# Patient Record
Sex: Female | Born: 1990 | Hispanic: Yes | Marital: Single | Smoking: Never smoker
Health system: Southern US, Community
[De-identification: ages and names within clinical notes are randomized; demographics above are authoritative.]

---

## 2008-11-14 ENCOUNTER — Ambulatory Visit (HOSPITAL_COMMUNITY): Admission: RE | Admit: 2008-11-14 | Discharge: 2008-11-14 | Payer: Self-pay | Admitting: Obstetrics and Gynecology

## 2008-12-28 ENCOUNTER — Ambulatory Visit (HOSPITAL_COMMUNITY): Admission: RE | Admit: 2008-12-28 | Discharge: 2008-12-28 | Payer: Self-pay | Admitting: Obstetrics and Gynecology

## 2009-11-30 IMAGING — US US OB FOLLOW-UP
1 series · 14 of 28 positions shown · non-contrast
Comparison: none

OBSTETRICAL ULTRASOUND:
 This ultrasound was performed in The [HOSPITAL], and the [facility] report will be stored to [REDACTED] PACS.

[Series 1: us ob follow-up · 14 of 58 slices shown]
[im 3/58]
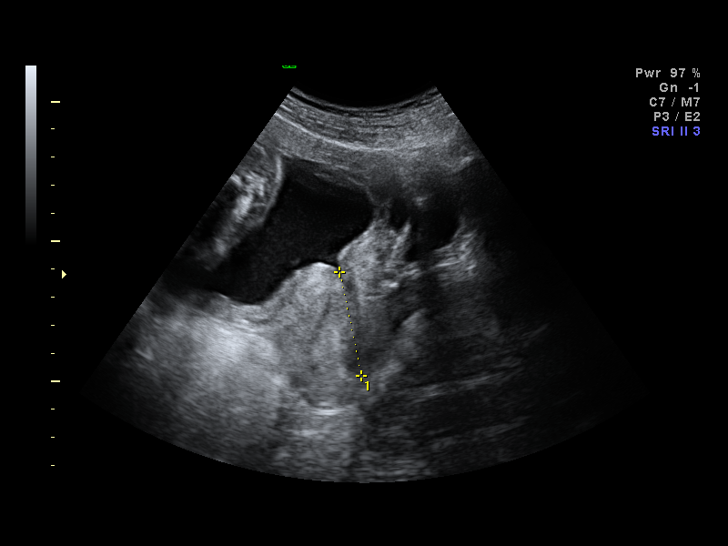
[im 7/58]
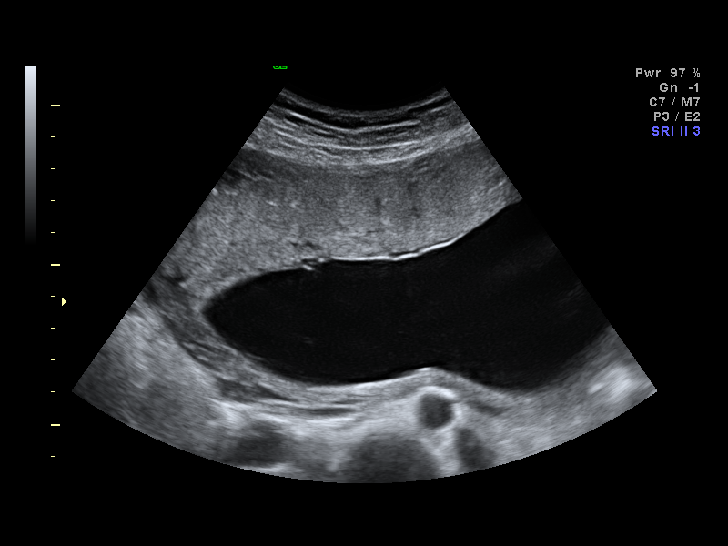
[im 11/58]
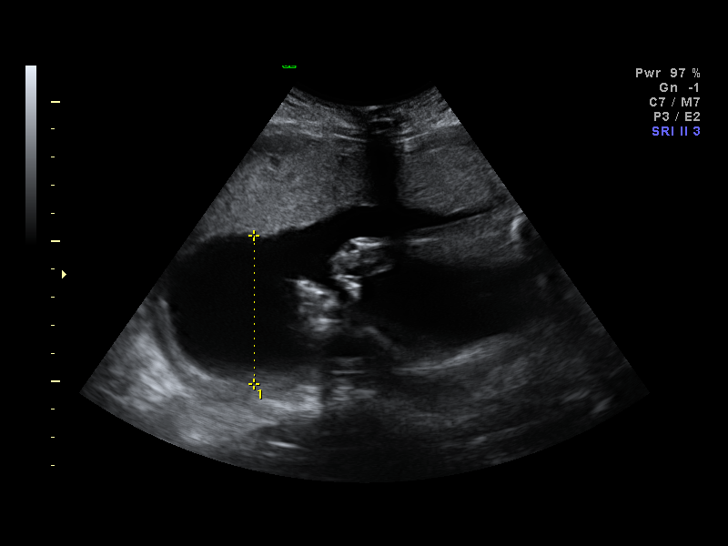
[im 15/58]
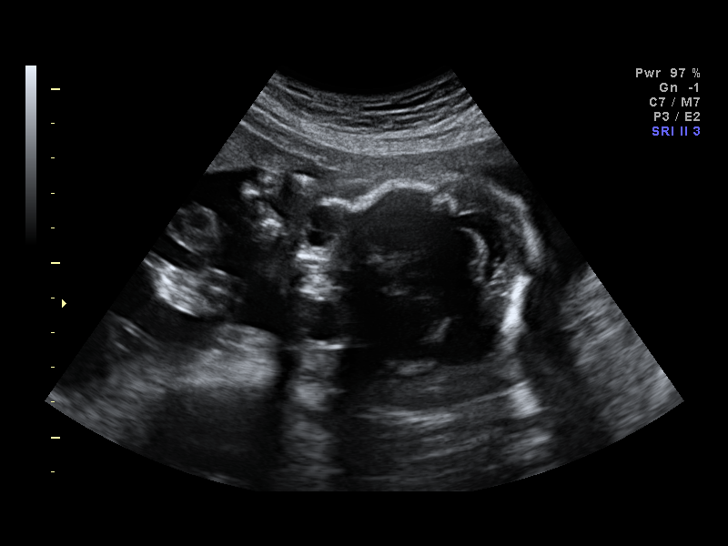
[im 20/58]
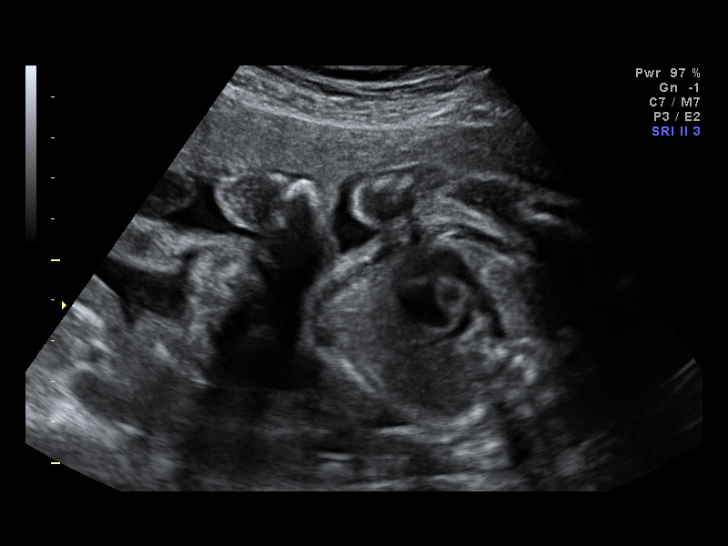
[im 24/58]
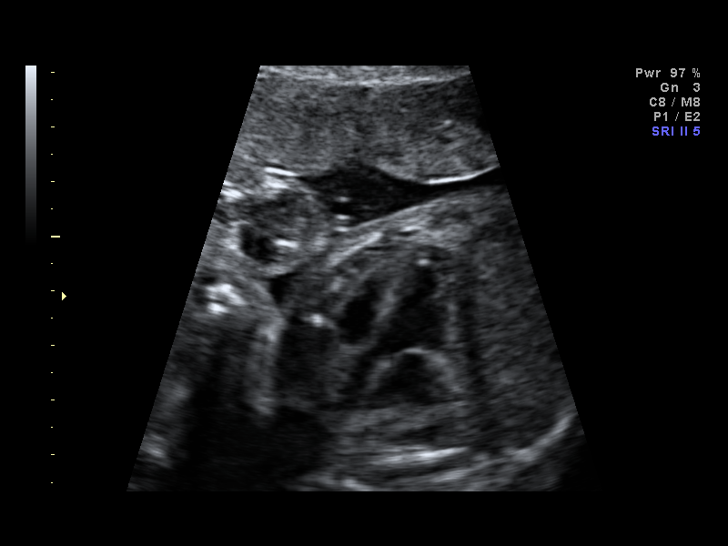
[im 28/58]
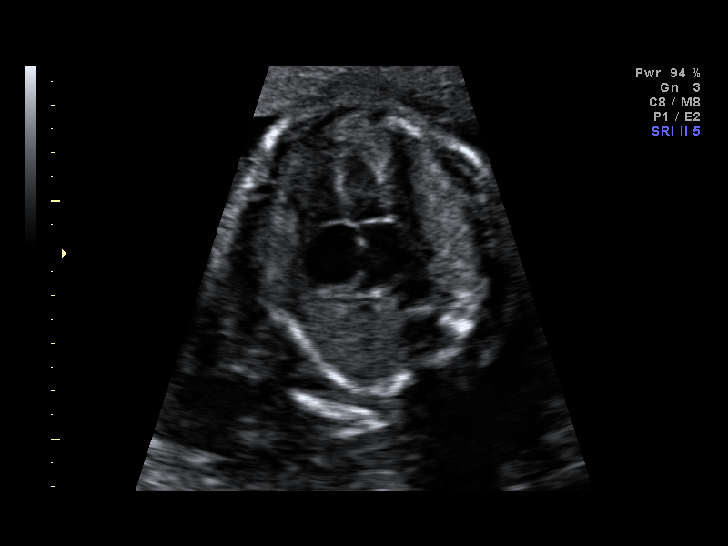
[im 32/58]
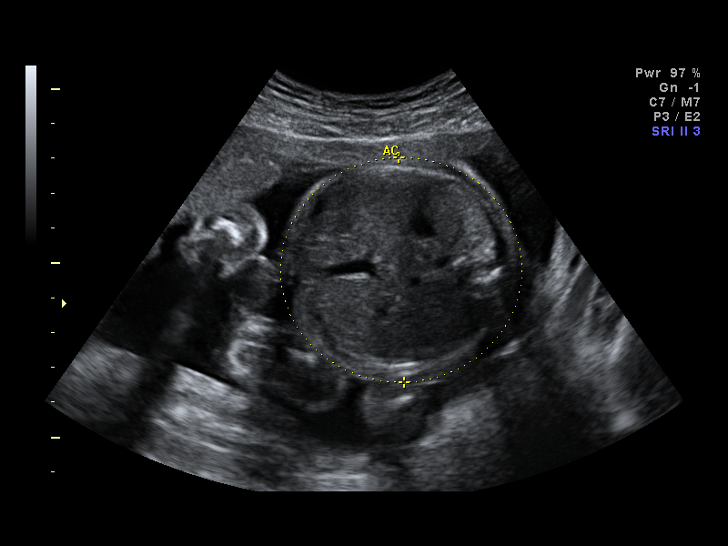
[im 36/58]
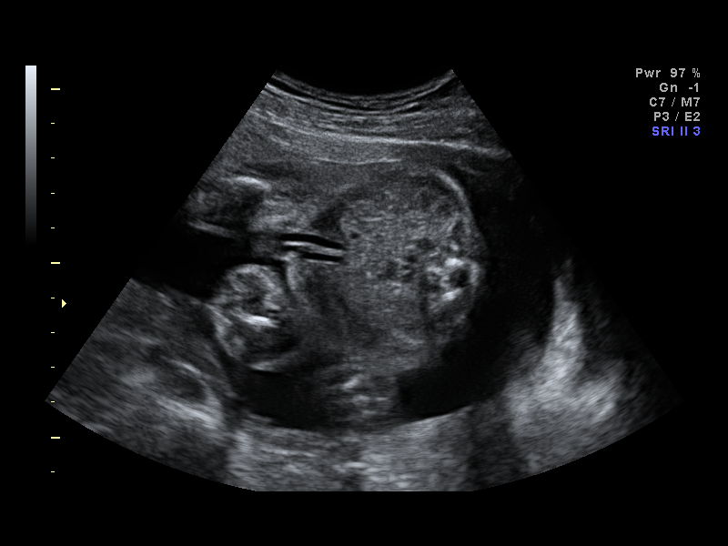
[im 41/58]
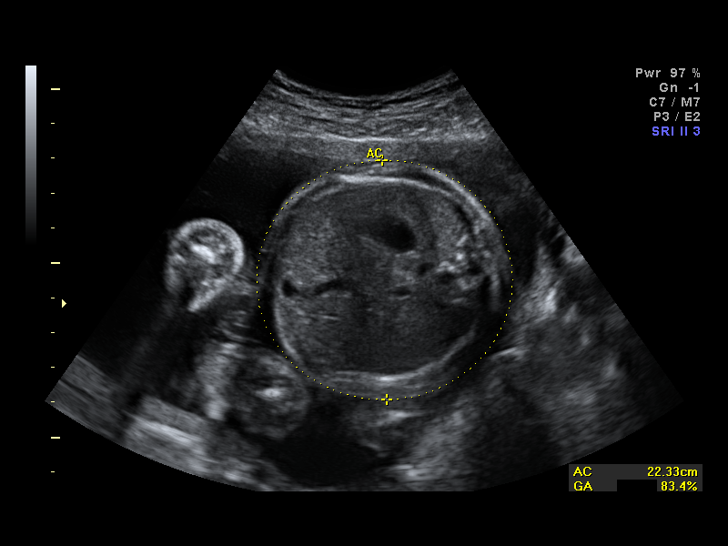
[im 45/58]
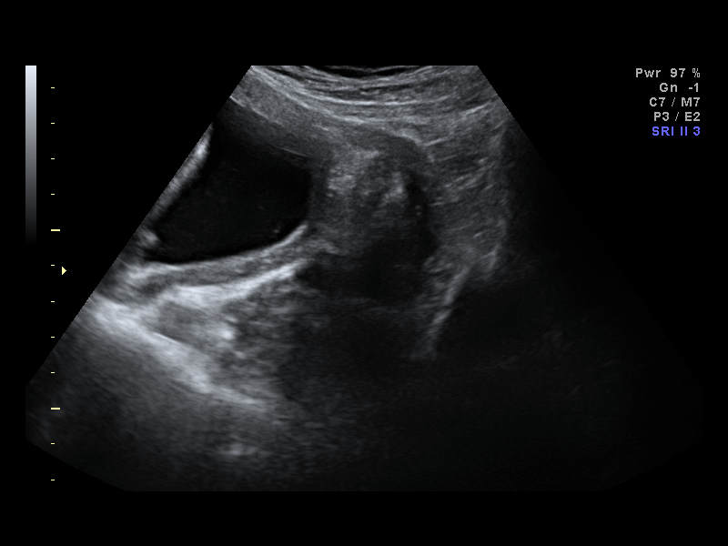
[im 49/58]
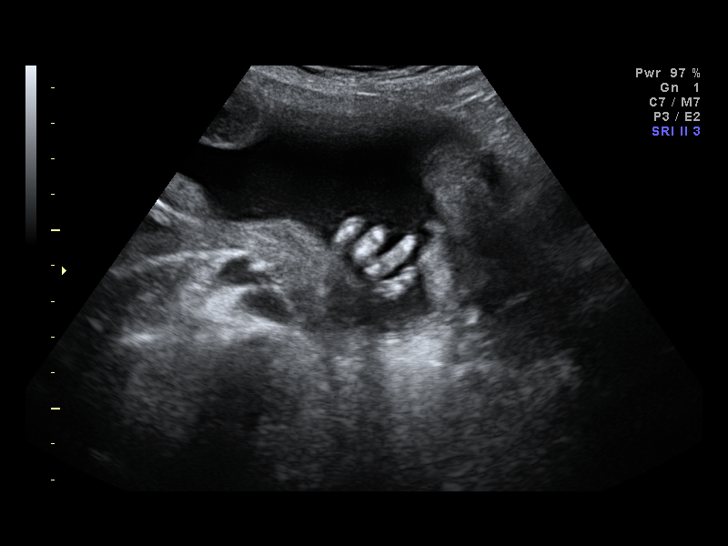
[im 53/58]
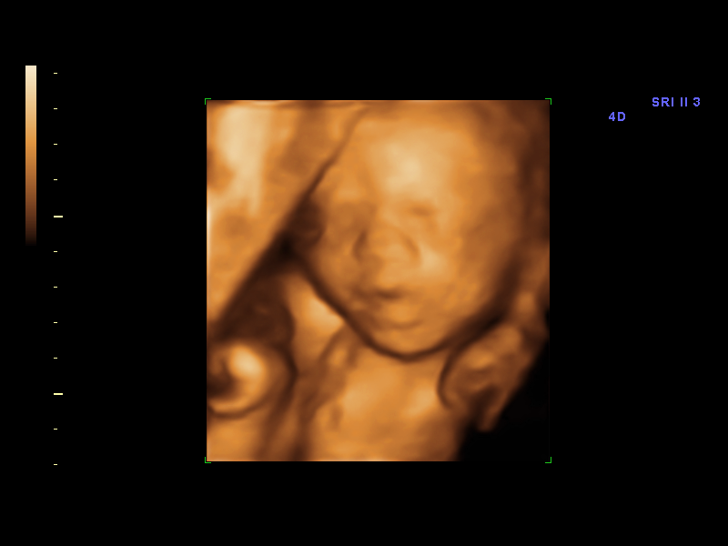
[im 58/58]
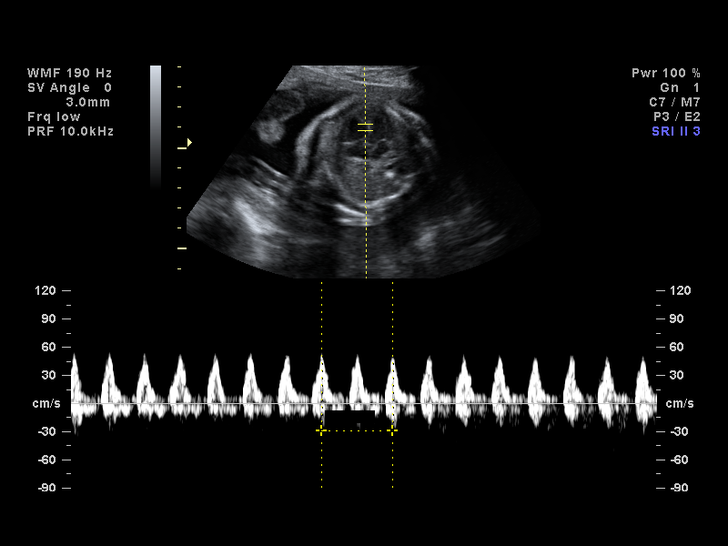

[14 of 28 positions shown; findings below may reference images not displayed]

IMPRESSION: [facility] has also been faxed to the ordering physician.

## 2011-01-13 ENCOUNTER — Encounter: Payer: Self-pay | Admitting: *Deleted

## 2017-01-07 ENCOUNTER — Emergency Department (HOSPITAL_BASED_OUTPATIENT_CLINIC_OR_DEPARTMENT_OTHER)
Admission: EM | Admit: 2017-01-07 | Discharge: 2017-01-07 | Disposition: A | Discharge status: Home / Self Care | Payer: BLUE CROSS/BLUE SHIELD | Attending: Emergency Medicine | Admitting: Emergency Medicine

## 2017-01-07 ENCOUNTER — Encounter (HOSPITAL_BASED_OUTPATIENT_CLINIC_OR_DEPARTMENT_OTHER): Payer: Self-pay | Admitting: *Deleted

## 2017-01-07 DIAGNOSIS — R110 Nausea: Secondary | ICD-10-CM | POA: Diagnosis not present

## 2017-01-07 DIAGNOSIS — R197 Diarrhea, unspecified: Secondary | ICD-10-CM | POA: Insufficient documentation

## 2017-01-07 LAB — CBC WITH DIFFERENTIAL/PLATELET
BASOS ABS: 0 10*3/uL (ref 0.0–0.1)
Basophils Relative: 0 %
EOS ABS: 0.1 10*3/uL (ref 0.0–0.7)
EOS PCT: 1 %
HCT: 36.7 % (ref 36.0–46.0)
Hemoglobin: 12.4 g/dL (ref 12.0–15.0)
LYMPHS PCT: 12 %
Lymphs Abs: 1.5 10*3/uL (ref 0.7–4.0)
MCH: 30.2 pg (ref 26.0–34.0)
MCHC: 33.8 g/dL (ref 30.0–36.0)
MCV: 89.5 fL (ref 78.0–100.0)
Monocytes Absolute: 0.5 10*3/uL (ref 0.1–1.0)
Monocytes Relative: 4 %
Neutro Abs: 10.3 10*3/uL — ABNORMAL HIGH (ref 1.7–7.7)
Neutrophils Relative %: 83 %
PLATELETS: 247 10*3/uL (ref 150–400)
RBC: 4.1 MIL/uL (ref 3.87–5.11)
RDW: 11.8 % (ref 11.5–15.5)
WBC: 12.5 10*3/uL — AB (ref 4.0–10.5)

## 2017-01-07 LAB — PREGNANCY, URINE: PREG TEST UR: NEGATIVE

## 2017-01-07 LAB — COMPREHENSIVE METABOLIC PANEL
ALT: 18 U/L (ref 14–54)
AST: 18 U/L (ref 15–41)
Albumin: 3.9 g/dL (ref 3.5–5.0)
Alkaline Phosphatase: 43 U/L (ref 38–126)
Anion gap: 6 (ref 5–15)
BUN: 11 mg/dL (ref 6–20)
CALCIUM: 8.9 mg/dL (ref 8.9–10.3)
CHLORIDE: 106 mmol/L (ref 101–111)
CO2: 25 mmol/L (ref 22–32)
CREATININE: 0.57 mg/dL (ref 0.44–1.00)
Glucose, Bld: 95 mg/dL (ref 65–99)
Potassium: 3.6 mmol/L (ref 3.5–5.1)
Sodium: 137 mmol/L (ref 135–145)
Total Bilirubin: 0.9 mg/dL (ref 0.3–1.2)
Total Protein: 6.7 g/dL (ref 6.5–8.1)

## 2017-01-07 LAB — URINALYSIS, MICROSCOPIC (REFLEX)

## 2017-01-07 LAB — URINALYSIS, ROUTINE W REFLEX MICROSCOPIC
Bilirubin Urine: NEGATIVE
GLUCOSE, UA: NEGATIVE mg/dL
Ketones, ur: NEGATIVE mg/dL
Leukocytes, UA: NEGATIVE
Nitrite: NEGATIVE
PROTEIN: NEGATIVE mg/dL
Specific Gravity, Urine: 1.006 (ref 1.005–1.030)
pH: 6.5 (ref 5.0–8.0)

## 2017-01-07 LAB — LIPASE, BLOOD: LIPASE: 25 U/L (ref 11–51)

## 2017-01-07 MED ORDER — ONDANSETRON 4 MG PO TBDP
4.0000 mg | ORAL_TABLET | Freq: Once | ORAL | Status: AC
  Administered 2017-01-07: 4 mg via ORAL
  Filled 2017-01-07: qty 1

## 2017-01-07 MED ORDER — PROMETHAZINE HCL 25 MG PO TABS: 25.0000 mg | ORAL_TABLET | Freq: Three times a day (TID) | ORAL | 0 refills | Status: AC | PRN

## 2017-01-07 NOTE — ED Provider Notes (Signed)
[facility]-EMERGENCY DEPT Provider Note   CSN: [id number] Arrival date & time: 01/07/17 [id number]     History    Chief Complaint  Patient presents with  . Nausea     HPI Cristina Gay is a 26 y.o. female.  25yo F who p/w nausea, decreased appetite and diarrhea. 4 days ago, the patient began having nausea and decreased appetite associated with nonbloody diarrhea. She has not had any episodes of vomiting. She has felt an upset stomach after eating but denies any severe abdominal pain. No urinary symptoms, fevers, cough/cold symptoms, vaginal bleeding/discharge, recent travel, or sick contacts.   History reviewed. No pertinent past medical history.   There are no active problems to display for this patient.   History reviewed. No pertinent surgical history.  OB History    No data available        Home Medications    Prior to Admission medications   Medication Sig Start Date End Date Taking? Authorizing Provider  promethazine (PHENERGAN) 25 MG tablet Take 1 tablet (25 mg total) by mouth every 8 (eight) hours as needed for nausea or vomiting. 01/07/17   Laurence Spatesachel Morgan Pete Merten, MD      History reviewed. No pertinent family history.   Social History  Substance Use Topics  . Smoking status: Never Smoker  . Smokeless tobacco: Never Used  . Alcohol use Not on file     Allergies     Patient has no known allergies.    Review of Systems  10 Systems reviewed and are negative for acute change except as noted in the HPI.   Physical Exam Updated Vital Signs BP 119/67 (BP Location: Left Arm)   Pulse (!) 58   Temp 98.2 F (36.8 C) (Oral)   Resp 18   Ht 5\' 3"  (1.6 m)   Wt 130 lb (59 kg)   LMP 12/11/2016   SpO2 99%   BMI 23.03 kg/m   Physical Exam  Constitutional: She is oriented to person, place, and time. She appears well-developed and well-nourished. No distress.  HENT:  Head: Normocephalic and atraumatic.  Mouth/Throat: Oropharynx is clear and moist.    Moist mucous membranes  Eyes: Conjunctivae are normal. Pupils are equal, round, and reactive to light.  Neck: Neck supple.  Cardiovascular: Normal rate, regular rhythm and normal heart sounds.   No murmur heard. Pulmonary/Chest: Effort normal and breath sounds normal.  Abdominal: Soft. Bowel sounds are normal. She exhibits no distension. There is no tenderness.  Musculoskeletal: She exhibits no edema.  Neurological: She is alert and oriented to person, place, and time.  Fluent speech  Skin: Skin is warm and dry.  Psychiatric: She has a normal mood and affect. Judgment normal.  Nursing note and vitals reviewed.     ED Treatments / Results  Labs (all labs ordered are listed, but only abnormal results are displayed) Labs Reviewed  URINALYSIS, ROUTINE W REFLEX MICROSCOPIC - Abnormal; Notable for the following:       Result Value   Hgb urine dipstick MODERATE (*)    All other components within normal limits  CBC WITH DIFFERENTIAL/PLATELET - Abnormal; Notable for the following:    WBC 12.5 (*)    Neutro Abs 10.3 (*)    All other components within normal limits  URINALYSIS, MICROSCOPIC (REFLEX) - Abnormal; Notable for the following:    Bacteria, UA FEW (*)    Squamous Epithelial / LPF 0-5 (*)    All other components within normal limits  PREGNANCY, URINE  COMPREHENSIVE METABOLIC PANEL  LIPASE, BLOOD     EKG  EKG Interpretation  Date/Time:    Ventricular Rate:    PR Interval:    QRS Duration:   QT Interval:    QTC Calculation:   R Axis:     Text Interpretation:           Radiology No results found.  Procedures Procedures (including critical care time) Procedures  Medications Ordered in ED  Medications  ondansetron (ZOFRAN-ODT) disintegrating tablet 4 mg (4 mg Oral Given 01/07/17 8413)     Initial Impression / Assessment and Plan / ED Course  I have reviewed the triage vital signs and the nursing notes.  Pertinent labs that were available during my  care of the patient were reviewed by me and considered in my medical decision making (see chart for details).  Clinical Course    Patient with 4 days of nausea, decreased appetite, and diarrhea. She was well-appearing on exam with normal vital signs. Abdomen soft and nontender. I obtained screening lab work to evaluate for gallbladder pathology or pancreatitis although my suspicion is low given no abdominal tenderness and no complaints of severe abdominal pain. Gave the patient Zofran and PO challenged.   Labs show WBC 12.5 but otherwise unremarkable. Pt tolerating fluids on reexamination w/ no nausea.  Sx suggest self-limited illness given diarrhea associated w/ nausea. His supportive care including continued hydration, bland diet, probiotics. Reviewed return precautions including severe abdominal pain, new fever, dehydration, or bloody stools. Patient voiced understanding and was discharged in satisfactory condition.  Final Clinical Impressions(s) / ED Diagnoses   Final diagnoses:  Nausea  Diarrhea, unspecified type     New Prescriptions   PROMETHAZINE (PHENERGAN) 25 MG TABLET    Take 1 tablet (25 mg total) by mouth every 8 (eight) hours as needed for nausea or vomiting.       Laurence Spates, MD 01/07/17 1021
